# Patient Record
Sex: Male | Born: 1975 | Race: White | Hispanic: Yes | Marital: Married | Smoking: Current every day smoker
Health system: Southern US, Community
[De-identification: ages and names within clinical notes are randomized; demographics above are authoritative.]

## PROBLEM LIST (undated history)

## (undated) DIAGNOSIS — E119 Type 2 diabetes mellitus without complications: Secondary | ICD-10-CM

---

## 2017-06-28 ENCOUNTER — Encounter: Payer: Self-pay | Admitting: Gynecology

## 2017-06-28 ENCOUNTER — Ambulatory Visit
Admission: EM | Admit: 2017-06-28 | Discharge: 2017-06-28 | Disposition: A | Discharge status: Home / Self Care | Payer: Self-pay | Attending: Family Medicine | Admitting: Family Medicine

## 2017-06-28 DIAGNOSIS — L255 Unspecified contact dermatitis due to plants, except food: Secondary | ICD-10-CM

## 2017-06-28 DIAGNOSIS — Z23 Encounter for immunization: Secondary | ICD-10-CM

## 2017-06-28 DIAGNOSIS — R21 Rash and other nonspecific skin eruption: Secondary | ICD-10-CM

## 2017-06-28 MED ORDER — TETANUS-DIPHTH-ACELL PERTUSSIS 5-2.5-18.5 LF-MCG/0.5 IM SUSP
0.5000 mL | Freq: Once | INTRAMUSCULAR | Status: AC
  Administered 2017-06-28: 0.5 mL via INTRAMUSCULAR

## 2017-06-28 MED ORDER — PREDNISONE 10 MG PO TABS: ORAL_TABLET | ORAL | 0 refills | Status: DC

## 2017-06-28 NOTE — ED Provider Notes (Signed)
[facility] ____________________________________________  Time seen: Approximately 9:34 AM  I have reviewed the triage vital signs and the nursing notes.   HISTORY  Chief Complaint Rash  HPI Bill Harrison is a 41 y.o. male presenting with spouse at bedside for evaluation of rash to bilateral arms that is itchy and present for the last week. Patient reports rash onset day after he was working outside in clearing brush along a fence line. States that he believes there is poison oak and poison ivy in the brush and states some of it scratched his right forearm. States rash initially began to right forearm and then quickly spread. States rash is very itchy. Denies any swelling, pain, purulent drainage, fevers or other rash. Denies any shortness of breath, wheezing, chest pain, facial or oral rash, difficulty swallowing or throat discomfort. Has been trying over-the-counter Benadryl cream and calamine lotion without much change. Denies other complaints. Reports otherwise feels well. Denies other aggravating or alleviating factors. Unsure of last tetanus immunization, but states greater than 10 years ago.   Denies chest pain, shortness of breath, abdominal pain, dysuria, extremity pain, extremity swelling. Denies recent sickness. Denies recent antibiotic use. Denies cardiac history. Denies renal insufficiency.   History reviewed. No pertinent past medical history. Denies   There are no active problems to display for this patient.   History reviewed. No pertinent surgical history.   No current facility-administered medications for this encounter.   Current Outpatient Prescriptions:  .  predniSONE (DELTASONE) 10 MG tablet, Take 60mg  orally day one, then 55 mg orally day two, then 50 mg orally day three, then taper by 5 mg daily over 12 days until complete., Disp: 35 tablet, Rfl: 0  Allergies Patient has no known allergies.  No family history on file.  Social  History Social History  Substance Use Topics  . Smoking status: Current Every Day Smoker    Packs/day: 0.50  . Smokeless tobacco: Never Used  . Alcohol use Yes    Review of Systems Constitutional: No fever/chills. ENT: No sore throat. Cardiovascular: Denies chest pain. Respiratory: Denies shortness of breath. Gastrointestinal: No abdominal pain.  Musculoskeletal: Negative for back pain. Skin: As above.   ____________________________________________   PHYSICAL EXAM:  VITAL SIGNS: ED Triage Vitals  Enc Vitals Group     BP 06/28/17 0922 138/77     Pulse Rate 06/28/17 0922 60     Resp 06/28/17 0922 16     Temp 06/28/17 0922 98.1 F (36.7 C)     Temp Source 06/28/17 0922 Oral     SpO2 06/28/17 0922 100 %     Weight 06/28/17 0920 230 lb (104.3 kg)     Height 06/28/17 0920 6\' 1"  (1.854 m)     Head Circumference --      Peak Flow --      Pain Score 06/28/17 0920 2     Pain Loc --      Pain Edu? --      Excl. in GC? --     Constitutional: Alert and oriented. Well appearing and in no acute distress. ENT      Head: Normocephalic and atraumatic.      Mouth/Throat: Mucous membranes are moist. Cardiovascular: Normal rate, regular rhythm. Grossly normal heart sounds.  Good peripheral circulation.  Respiratory: Normal respiratory effort without tachypnea nor retractions. Breath sounds are clear and equal bilaterally. No wheezes, rales, rhonchi. Musculoskeletal: Steady gait. Neurologic:  Normal speech and language.Speech is normal. No gait instability.  Skin:  Skin is warm, dry. Except: bilateral forearms and upper arms with vesicular pruritic rash in clusters and linear groups, healing abrasion to right forearm, some with dried crusts, some vesicular filled, no drainage, no surrounding erythema, nontender, no honey colored crusting, no edema, full range of motion present.  Psychiatric: Mood and affect are normal. Speech and behavior are normal. Patient exhibits appropriate  insight and judgment   ___________________________________________   LABS (all labs ordered are listed, but only abnormal results are displayed)  Labs Reviewed - No data to display ____________________________________________  RADIOLOGY  No results found. ____________________________________________   PROCEDURES Procedures    INITIAL IMPRESSION / ASSESSMENT AND PLAN / ED COURSE  Pertinent labs & imaging results that were available during my care of the patient were reviewed by me and considered in my medical decision making (see chart for details).  Well-appearing patient. No acute distress. Appearance of the plant contact dermatitis to bilateral arms. Tetanus immunization updated. No appearance of secondary infection, but discussed use of Bactroban ointment, which they state they have at home, for any redness appearance use. Avoid trigger. Avoid scratching. Prednisone taper. Encourage fluids, and supportive care. Discussed indication, risks and benefits of medications with patient.  Discussed follow up with Primary care physician this week. Discussed follow up and return parameters including no resolution or any worsening concerns. Patient verbalized understanding and agreed to plan.   ____________________________________________   FINAL CLINICAL IMPRESSION(S) / ED DIAGNOSES  Final diagnoses:  Contact dermatitis due to plant     Discharge Medication List as of 06/28/2017  9:48 AM    START taking these medications   Details  predniSONE (DELTASONE) 10 MG tablet Take 60mg  orally day one, then 55 mg orally day two, then 50 mg orally day three, then taper by 5 mg daily over 12 days until complete., Normal        Note: This dictation was prepared with Dragon dictation along with smaller phrase technology. Any transcriptional errors that result from this process are unintentional.         Renford Dills, NP 06/28/17 1050

## 2017-06-28 NOTE — Discharge Instructions (Signed)
Take medication as prescribed. Avoid trigger. Avoid scratching. Drink plenty of fluids.   Follow up with your primary care physician this week as needed. Return to Urgent care for new or worsening concerns.

## 2017-06-28 NOTE — ED Triage Notes (Signed)
Patient with rash on bilateral arms.

## 2018-02-11 ENCOUNTER — Other Ambulatory Visit: Payer: Self-pay

## 2018-02-11 ENCOUNTER — Encounter: Payer: Self-pay | Admitting: Emergency Medicine

## 2018-02-11 ENCOUNTER — Ambulatory Visit
Admission: EM | Admit: 2018-02-11 | Discharge: 2018-02-11 | Disposition: A | Discharge status: Home / Self Care | Payer: Self-pay | Attending: Family Medicine | Admitting: Family Medicine

## 2018-02-11 DIAGNOSIS — L0231 Cutaneous abscess of buttock: Secondary | ICD-10-CM

## 2018-02-11 MED ORDER — MUPIROCIN 2 % EX OINT: 1.0000 "application " | TOPICAL_OINTMENT | Freq: Two times a day (BID) | CUTANEOUS | 0 refills | Status: DC

## 2018-02-11 MED ORDER — DOXYCYCLINE HYCLATE 100 MG PO CAPS: 100.0000 mg | ORAL_CAPSULE | Freq: Two times a day (BID) | ORAL | 0 refills | Status: DC

## 2018-02-11 NOTE — Discharge Instructions (Signed)
Warm compresses several times daily.  Medications as prescribed.  Take care  Dr. Adriana Simasook

## 2018-02-11 NOTE — ED Provider Notes (Signed)
[facility]    CSN: [id number] Arrival date & time: 02/11/18  1018   History   Chief Complaint Chief Complaint  Patient presents with  . Abscess   HPI   42 year old male presents with the above complaint.  Patient states that he has had an abscess on his right buttock for the past 3 days.  He states that yesterday he squeezed it and it drained.  States that since that time, he is felt improved.  It is tender, moderate in severity.  No reported fever or chills.  He does report erythema at the site.  No known inciting factor.  No known exacerbating factors.  No other associated symptoms.  No other complaints.  Social History Social History   Tobacco Use  . Smoking status: Current Every Day Smoker    Packs/day: 0.50  . Smokeless tobacco: Never Used  Substance Use Topics  . Alcohol use: Yes  . Drug use: No   Allergies   Patient has no known allergies.  Review of Systems Review of Systems  Constitutional: Negative for fever.  Musculoskeletal:       Right buttock abscess.   Physical Exam Triage Vital Signs ED Triage Vitals  Enc Vitals Group     BP 02/11/18 1032 (!) 147/100     Pulse Rate 02/11/18 1032 81     Resp 02/11/18 1032 16     Temp 02/11/18 1032 98.9 F (37.2 C)     Temp Source 02/11/18 1032 Oral     SpO2 02/11/18 1032 100 %     Weight 02/11/18 1030 240 lb (108.9 kg)     Height 02/11/18 1030 6\' 2"  (1.88 m)     Head Circumference --      Peak Flow --      Pain Score 02/11/18 1030 4     Pain Loc --      Pain Edu? --      Excl. in GC? --    Updated Vital Signs BP (!) 147/100 (BP Location: Left Arm)   Pulse 81   Temp 98.9 F (37.2 C) (Oral)   Resp 16   Ht 6\' 2"  (1.88 m)   Wt 240 lb (108.9 kg)   SpO2 100%   BMI 30.81 kg/m   Physical Exam  Constitutional: He is oriented to person, place, and time. He appears well-developed. No distress.  HENT:  Head: Normocephalic and atraumatic.  Pulmonary/Chest: Effort normal. No respiratory  distress.  Neurological: He is alert and oriented to person, place, and time.  Skin:  Right buttock - 5 cm x 4 cm area of erythema and induration. Draining currently. Tender to palpation.  Psychiatric: He has a normal mood and affect. His behavior is normal.  Nursing note and vitals reviewed.   UC Treatments / Results  Labs (all labs ordered are listed, but only abnormal results are displayed) Labs Reviewed - No data to display  EKG None Radiology No results found.  Procedures Procedures (including critical care time)  Medications Ordered in UC Medications - No data to display   Initial Impression / Assessment and Plan / UC Course  I have reviewed the triage vital signs and the nursing notes.  Pertinent labs & imaging results that were available during my care of the patient were reviewed by me and considered in my medical decision making (see chart for details).    42 year old male presents with an abscess.  Treating with doxycycline and mupirocin.  No I&D today as it  is already draining.  He is already improving.  Final Clinical Impressions(s) / UC Diagnoses   Final diagnoses:  Abscess of buttock, right    ED Discharge Orders        Ordered    doxycycline (VIBRAMYCIN) 100 MG capsule  2 times daily     02/11/18 1050    mupirocin ointment (BACTROBAN) 2 %  2 times daily     02/11/18 1050     Controlled Substance Prescriptions [place] Controlled Substance Registry consulted? Not Applicable   Tommie SamsCook, Bethel Gaglio G, DO 02/11/18 1128

## 2018-02-11 NOTE — ED Triage Notes (Signed)
Patient c/o abscess on his right buttock for the past 3 days.

## 2021-02-04 ENCOUNTER — Encounter: Payer: Self-pay | Admitting: Emergency Medicine

## 2021-02-04 ENCOUNTER — Ambulatory Visit (INDEPENDENT_AMBULATORY_CARE_PROVIDER_SITE_OTHER): Payer: 59

## 2021-02-04 ENCOUNTER — Ambulatory Visit
Admission: EM | Admit: 2021-02-04 | Discharge: 2021-02-04 | Disposition: A | Discharge status: Home / Self Care | Payer: 59 | Attending: Family Medicine | Admitting: Family Medicine

## 2021-02-04 ENCOUNTER — Other Ambulatory Visit: Payer: Self-pay

## 2021-02-04 DIAGNOSIS — M79645 Pain in left finger(s): Secondary | ICD-10-CM | POA: Diagnosis not present

## 2021-02-04 DIAGNOSIS — R2232 Localized swelling, mass and lump, left upper limb: Secondary | ICD-10-CM

## 2021-02-04 DIAGNOSIS — L089 Local infection of the skin and subcutaneous tissue, unspecified: Secondary | ICD-10-CM

## 2021-02-04 MED ORDER — DOXYCYCLINE HYCLATE 100 MG PO CAPS: 100.0000 mg | ORAL_CAPSULE | Freq: Two times a day (BID) | ORAL | 0 refills | Status: DC

## 2021-02-04 MED ORDER — MUPIROCIN 2 % EX OINT: 1.0000 "application " | TOPICAL_OINTMENT | Freq: Three times a day (TID) | CUTANEOUS | 0 refills | Status: AC

## 2021-02-04 NOTE — ED Provider Notes (Signed)
[facility]    CSN: [id number] Arrival date & time: 02/04/21  0919      History   Chief Complaint Chief Complaint  Patient presents with  . Foreign Body in Skin   HPI  45 year old male presents with the above complaint.  Patient works [employer] representative.  He states that he is concerned that he has something in his finger.  He does not recall a direct injury or direct trauma with a foreign body.  The area is very swollen and erythematous.  He has now drained the area and has been draining pus.  He denies any significant pain currently.  No relieving factors.  No fever.  No other complaints.   Home Medications    Prior to Admission medications   Medication Sig Start Date End Date Taking? Authorizing Provider  doxycycline (VIBRAMYCIN) 100 MG capsule Take 1 capsule (100 mg total) by mouth 2 (two) times daily. 02/04/21  Yes Marnae Madani G, DO  mupirocin ointment (BACTROBAN) 2 % Apply 1 application topically 3 (three) times daily for 7 days. 02/04/21 02/11/21 Yes Tommie Sams, DO    Family History Family History  Problem Relation Age of Onset  . Diabetes Mother   . Diabetes Father     Social History Social History   Tobacco Use  . Smoking status: Current Every Day Smoker    Packs/day: 0.50  . Smokeless tobacco: Never Used  Substance Use Topics  . Alcohol use: Yes  . Drug use: No     Allergies   Patient has no known allergies.   Review of Systems Review of Systems  Constitutional: Negative.   Skin: Positive for wound.   Physical Exam Triage Vital Signs ED Triage Vitals  Enc Vitals Group     BP 02/04/21 1040 134/81     Pulse Rate 02/04/21 1040 (!) 56     Resp 02/04/21 1040 18     Temp 02/04/21 1040 98 F (36.7 C)     Temp Source 02/04/21 1040 Oral     SpO2 02/04/21 1040 100 %     Weight 02/04/21 1038 240 lb (108.9 kg)     Height 02/04/21 1038 6\' 2"  (1.88 m)     Head Circumference --      Peak Flow --      Pain Score 02/04/21 1038 0     Pain Loc --       Pain Edu? --      Excl. in GC? --    Updated Vital Signs BP 134/81 (BP Location: Left Arm)   Pulse (!) 56   Temp 98 F (36.7 C) (Oral)   Resp 18   Ht 6\' 2"  (1.88 m)   Wt 108.9 kg   SpO2 100%   BMI 30.81 kg/m   Visual Acuity Right Eye Distance:   Left Eye Distance:   Bilateral Distance:    Right Eye Near:   Left Eye Near:    Bilateral Near:     Physical Exam Constitutional:      General: He is not in acute distress.    Appearance: He is not ill-appearing.  HENT:     Head: Normocephalic and atraumatic.  Eyes:     General:        Right eye: No discharge.        Left eye: No discharge.     Conjunctiva/sclera: Conjunctivae normal.  Pulmonary:     Effort: Pulmonary effort is normal. No respiratory distress.  Musculoskeletal:  Comments: Left index finger -swelling and erythema noted to the proximal finger above the MCP joint.  There is purulent discharge/drainage noted.  Tender to palpation.  Neurological:     Mental Status: He is alert.  Psychiatric:        Mood and Affect: Mood normal.        Behavior: Behavior normal.    UC Treatments / Results  Labs (all labs ordered are listed, but only abnormal results are displayed) Labs Reviewed - No data to display  EKG   Radiology DG Finger Index Left  Result Date: 02/04/2021 CLINICAL DATA:  Possible metal or other foreign body in finger for 1 week EXAM: LEFT INDEX FINGER 2+V COMPARISON:  None. FINDINGS: There is no evidence of fracture or dislocation. There is no evidence of arthropathy or other focal bone abnormality. Soft tissue edema about the digit. No radiopaque foreign body. IMPRESSION: 1. No fracture or dislocation of the left index finger. No radiopaque foreign body. 2.  Soft tissue edema about the digit. Electronically Signed   By: Lauralyn Primes M.D.   On: 02/04/2021 11:09    Procedures Procedures (including critical care time)  Medications Ordered in UC Medications - No data to display  Initial  Impression / Assessment and Plan / UC Course  I have reviewed the triage vital signs and the nursing notes.  Pertinent labs & imaging results that were available during my care of the patient were reviewed by me and considered in my medical decision making (see chart for details).    45 year old male presents with concern for a foreign body in his left index finger.  Swelling, erythema, and purulent drainage noted on exam.  Area is clearly infected.  X-ray was obtained and was independent reviewed by me.  Interpretation: No appreciable foreign body.  Swelling noted.  Placing on antibiotic therapy with doxycycline and Bactroban ointment.  Advised to go to the hospital if he worsens or fails to improve.  Final Clinical Impressions(s) / UC Diagnoses   Final diagnoses:  Finger infection     Discharge Instructions     Xray negative.  Medication as prescribed.  If this worsens or does not improve, go to the hospital.  Dr. Adriana Simas    ED Prescriptions    Medication Sig Dispense Auth. Provider   doxycycline (VIBRAMYCIN) 100 MG capsule Take 1 capsule (100 mg total) by mouth 2 (two) times daily. 20 capsule [place], Kinta Martis G, DO   mupirocin ointment (BACTROBAN) 2 % Apply 1 application topically 3 (three) times daily for 7 days. 30 g Tommie Sams, DO     PDMP not reviewed this encounter.   Tommie Sams, [place] 02/04/21 1132

## 2021-02-04 NOTE — ED Triage Notes (Signed)
Patient states he thinks he has some metal or wood in his left pointer finger x 1 week.

## 2021-02-04 NOTE — Discharge Instructions (Signed)
Xray negative.  Medication as prescribed.  If this worsens or does not improve, go to the hospital.  Dr. Adriana Simas

## 2021-10-24 ENCOUNTER — Ambulatory Visit
Admission: EM | Admit: 2021-10-24 | Discharge: 2021-10-24 | Disposition: A | Discharge status: Home / Self Care | Payer: BC Managed Care – PPO | Attending: Emergency Medicine | Admitting: Emergency Medicine

## 2021-10-24 ENCOUNTER — Other Ambulatory Visit: Payer: Self-pay

## 2021-10-24 DIAGNOSIS — E871 Hypo-osmolality and hyponatremia: Secondary | ICD-10-CM | POA: Diagnosis present

## 2021-10-24 DIAGNOSIS — M791 Myalgia, unspecified site: Secondary | ICD-10-CM | POA: Diagnosis not present

## 2021-10-24 HISTORY — DX: Type 2 diabetes mellitus without complications: E11.9

## 2021-10-24 LAB — CK: Total CK: 110 U/L (ref 49–397)

## 2021-10-24 LAB — COMPREHENSIVE METABOLIC PANEL
ALT: 30 U/L (ref 0–44)
AST: 24 U/L (ref 15–41)
Albumin: 4.4 g/dL (ref 3.5–5.0)
Alkaline Phosphatase: 87 U/L (ref 38–126)
Anion gap: 8 (ref 5–15)
BUN: 12 mg/dL (ref 6–20)
CO2: 24 mmol/L (ref 22–32)
Calcium: 9.2 mg/dL (ref 8.9–10.3)
Chloride: 99 mmol/L (ref 98–111)
Creatinine, Ser: 0.75 mg/dL (ref 0.61–1.24)
GFR, Estimated: >60 mL/min (ref >60)
Glucose, Bld: 211 mg/dL — ABNORMAL HIGH (ref 70–99)
Potassium: 4 mmol/L (ref 3.5–5.1)
Sodium: 131 mmol/L — ABNORMAL LOW (ref 135–145)
Total Bilirubin: 1.4 mg/dL — ABNORMAL HIGH (ref 0.3–1.2)
Total Protein: 7.4 g/dL (ref 6.5–8.1)

## 2021-10-24 NOTE — Discharge Instructions (Addendum)
Your blood work today was all reassuring though your sodium was mildly low.  Continue your metformin as directed at your time of discharge from the emergency department.  When should increase your oral fluid intake to a goal of 64 ounces a day.  You need to drink water and diet Gatorade or Pedialyte as your sodium is a little low and you need to make sure that you are replacing that through your diet.  Follow the diabetic diet plan provided at discharge.  At your time of discharge from the emergency department you were referred to family medicine to establish care for your diabetes.  Please make and keep that follow-up appointment.

## 2021-10-24 NOTE — ED Provider Notes (Signed)
[facility]    CSN: [id number] Arrival date & time: 10/24/21  1019      History   Chief Complaint Chief Complaint  Patient presents with   Fatigue    HPI Bill Harrison is a 45 y.o. male.   HPI  45 year old male here for evaluation of body aches and fatigue.  Patient was evaluated and [facility] emergency department 4 days ago and started on metformin for hyperglycemia.  He started taking his medication 3 days ago.  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  He is also had some mild nausea.  Patient's initial blood sugar in the emergency department was 500 and he has been checking it every morning.  This morning his blood sugar was 139 fasting.  Patient reports that he is consuming fruit and beans but he is avoiding rice and potatoes present.  He also reports that he only drinks 3-4 bottles of water during the day.  Past Medical History:  Diagnosis Date   Diabetes mellitus without complication ([place])     There are no problems to display for this patient.   History reviewed. No pertinent surgical history.     Home Medications    Prior to Admission medications   Medication Sig Start Date End Date Taking? Authorizing Provider  Blood Glucose Monitoring Suppl (FIFTY50 GLUCOSE METER 2.0) w/Device KIT See admin instructions. 10/19/21 10/19/22 Yes [provider]  CONTOUR NEXT TEST test strip  10/20/21   [provider]  metFORMIN (GLUCOPHAGE) 500 MG tablet Take 500 mg by mouth 2 (two) times daily. 10/20/21   [provider]  Microlet Lancets MISC  10/20/21   [provider]    Family History Family History  Problem Relation Age of Onset   Diabetes Mother    Diabetes Father     Social History Social History   Tobacco Use   Smoking status: Every Day    Packs/day: 0.50     Types: Cigarettes   Smokeless tobacco: Never  Substance Use Topics   Alcohol use: Yes   Drug use: No     Allergies   Patient has no known allergies.   Review of Systems Review of Systems  Constitutional:  Positive for fatigue. Negative for activity change, appetite change and fever.  Gastrointestinal:  Positive for nausea. Negative for abdominal pain, diarrhea and vomiting.  Musculoskeletal:  Positive for myalgias.  Hematological: Negative.   Psychiatric/Behavioral: Negative.      Physical Exam Triage Vital Signs ED Triage Vitals  Enc Vitals Group     BP 10/24/21 1132 138/86     Pulse Rate 10/24/21 1132 66     Resp 10/24/21 1132 18     Temp 10/24/21 1132 98.7 F (37.1 C)     Temp Source 10/24/21 1132 Oral     SpO2 10/24/21 1132 100 %     Weight 10/24/21 1130 210 lb (95.3 kg)     Height 10/24/21 1130 6' (1.829 m)     Head Circumference --      Peak Flow --      Pain Score 10/24/21 1129 0     Pain Loc --      Pain Edu? --      Excl. in [place]? --    No data found.  Updated Vital Signs BP 138/86 (BP Location: Left  Arm)    Pulse 66    Temp 98.7 F (37.1 C) (Oral)    Resp 18    Ht 6' (1.829 m)    Wt 210 lb (95.3 kg)    SpO2 100%    BMI 28.48 kg/m   Visual Acuity Right Eye Distance:   Left Eye Distance:   Bilateral Distance:    Right Eye Near:   Left Eye Near:    Bilateral Near:     Physical Exam Vitals and nursing note reviewed.  Constitutional:      General: He is not in acute distress.    Appearance: Normal appearance. He is not ill-appearing.  HENT:     Head: Normocephalic and atraumatic.     Nose: Nose normal.     Mouth/Throat:     Mouth: Mucous membranes are moist.     Pharynx: Oropharynx is clear. No oropharyngeal exudate or posterior oropharyngeal erythema.  Eyes:     General: No scleral icterus.    Extraocular Movements: Extraocular movements intact.     Pupils: Pupils are equal, round, and reactive to light.  Cardiovascular:     Rate and  Rhythm: Normal rate and regular rhythm.     Pulses: Normal pulses.     Heart sounds: Normal heart sounds. No murmur heard.   No gallop.  Pulmonary:     Effort: Pulmonary effort is normal.     Breath sounds: Normal breath sounds. No wheezing, rhonchi or rales.  Abdominal:     General: Abdomen is flat.     Palpations: Abdomen is soft.     Tenderness: There is no abdominal tenderness.  Musculoskeletal:        General: No swelling, tenderness, deformity or signs of injury. Normal range of motion.     Cervical back: Normal range of motion and neck supple.  Lymphadenopathy:     Cervical: No cervical adenopathy.  Skin:    General: Skin is warm and dry.     Capillary Refill: Capillary refill takes less than 2 seconds.     Findings: No erythema or rash.  Neurological:     General: No focal deficit present.     Mental Status: He is alert and oriented to person, place, and time.  Psychiatric:        Mood and Affect: Mood normal.        Behavior: Behavior normal.        Thought Content: Thought content normal.        Judgment: Judgment normal.     UC Treatments / Results  Labs (all labs ordered are listed, but only abnormal results are displayed) Labs Reviewed  COMPREHENSIVE METABOLIC PANEL - Abnormal; Notable for the following components:      Result Value   Sodium 131 (*)    Glucose, Bld 211 (*)    Total Bilirubin 1.4 (*)    All other components within normal limits  CK    EKG   Radiology No results found.  Procedures Procedures (including critical care time)  Medications Ordered in UC Medications - No data to display  Initial Impression / Assessment and Plan / UC Course  I have reviewed the triage vital signs and the nursing notes.  Pertinent labs & imaging results that were available during my care of the patient were reviewed by me and considered in my medical decision making (see chart for details).  Patient is a nontoxic-appearing 45 year old male here for  evaluation of body aches and fatigue with  associated nausea that started yesterday afternoon.  He was recently started on metformin for hyperglycemia and he has been trying to figure out his diet but he is still consuming fruit and beans.  He denies any rice or potatoes.  But he still consuming a carbohydrate rich diet.  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  At the end of the day he had pain in both of his forearms but nowhere else on his body.  He has had some churning in his abdomen when he eats.  When asked to describe his diet he has had excessive beans, fruit, and chicken.  He also states that he only drinks about 3-4 bottles of water during the course of the day.  On physical exam patient is alert and oriented x4.  Pupils are equal round reactive and EOMs intact.  No scleral icterus noted.  Cardiopulmonary exam reveals clear lung sounds in all fields and heart sounds are S1-S2 without murmur, rub, or gallop.  Abdomen is soft and nontender.  Patient has no tenderness with palpation of the muscle groups of either arm, or his back.  He states that he still feels like it sore and he also had some mild tremors in his fingers when he got home yesterday and some tremors when he tried to sleep last night.  Those have resolved.  Given patient's recent metformin prescription I suspect that patient's symptoms are partially a result of normal effects of the medication, especially coupled with his carbohydrate rich diet, as far as GI symptoms ago.  Without any tenderness to palpation of the muscle groups I do not feel that he is experiencing lactic acidosis, which can happen as result of metformin, but I will check a CMP and CK.  I am unable to check lactic acid level at the urgent care due to processing issues.  I suspect that his muscle pain and fatigue is more result of his physical labor versus adverse effects from the metformin.  If patient's blood work  is normal I will discharge the patient home with an ADA diet plan.  I have also encouraged him to increase his water intake to at least 64 ounces a day, and closer to his body weight in ounces if he is working in the heat.  CMP shows mild hyponatremia with a sodium of 131.  Potassium, BUN creatinine, and calcium are all normal.  Transaminases are unremarkable.  T bili is mildly elevated 1.4.  Glucose is 211 the patient is postprandial.  Total CK is 110.  I suspect the patient's muscle soreness in his forearms is secondary to his manual labor and his GI symptoms are secondary to the metformin.  I will have the patient improve his oral intake of water to improve hydration and we will also discharge patient home with an ADA diet plan.  Patient to follow-up with PCP.   Final Clinical Impressions(s) / UC Diagnoses   Final diagnoses:  Myalgia  Hyponatremia     Discharge Instructions      Your blood work today was all reassuring though your sodium was mildly low.  Continue your metformin as directed at your time of discharge from the emergency department.  When should increase your oral fluid intake to a goal of 64 ounces a day.  You need to drink water and diet Gatorade or Pedialyte as your sodium is a little low and you need to make sure that you are  replacing that through your diet.  Follow the diabetic diet plan provided at discharge.  At your time of discharge from the emergency department you were referred to family medicine to establish care for your diabetes.  Please make and keep that follow-up appointment.     ED Prescriptions   None    PDMP not reviewed this encounter.   Margarette Canada, NP 10/24/21 [phone]

## 2021-10-24 NOTE — ED Triage Notes (Signed)
Pt here with C/O body aches and fatigue. Was in ER last weekend for hyperglycemia and started taken medication on Monday. States he started feeling that yesterday evening. [id number] this morning.

## 2022-11-27 IMAGING — CR DG FINGER INDEX 2+V*L*
3 series · 3 of 3 positions shown · non-contrast
Comparison: None.

CLINICAL DATA: Possible metal or other foreign body in finger for 1
week

EXAM:
LEFT INDEX FINGER 2+V

[finger ap]
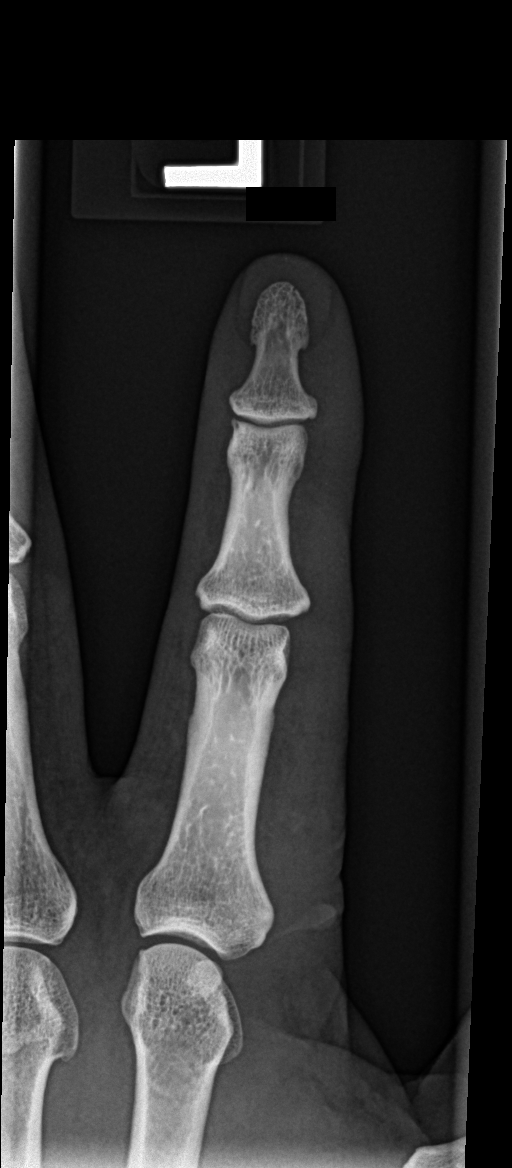

[finger obl]
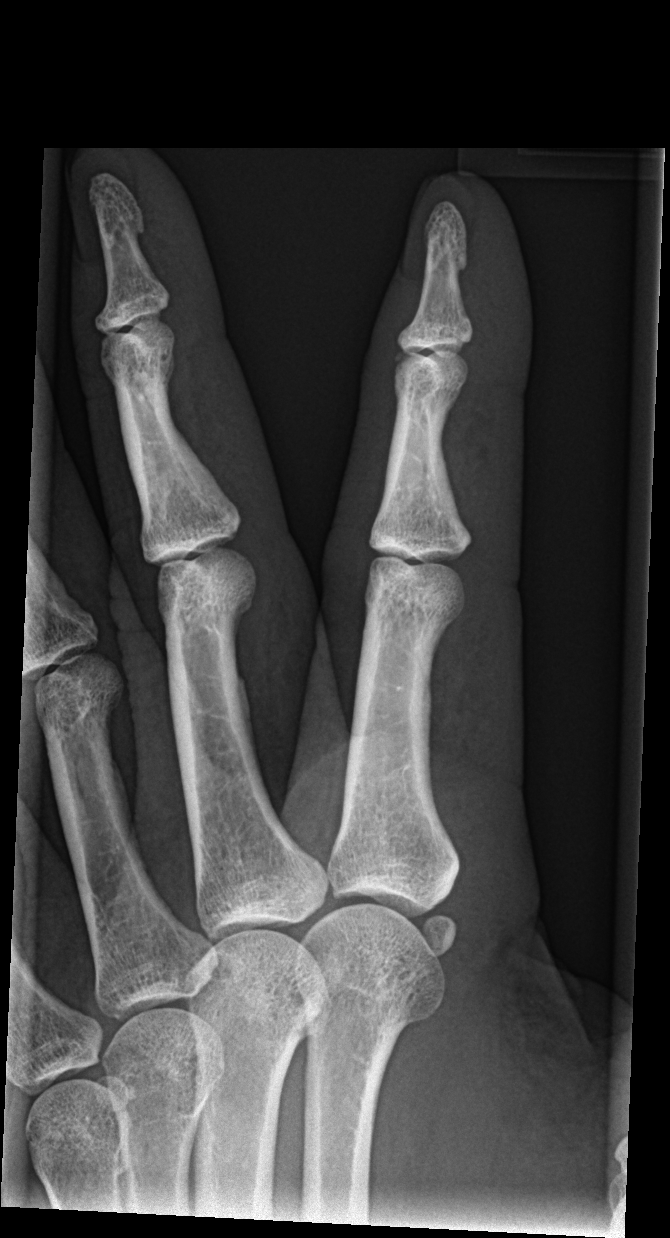

[finger lat]
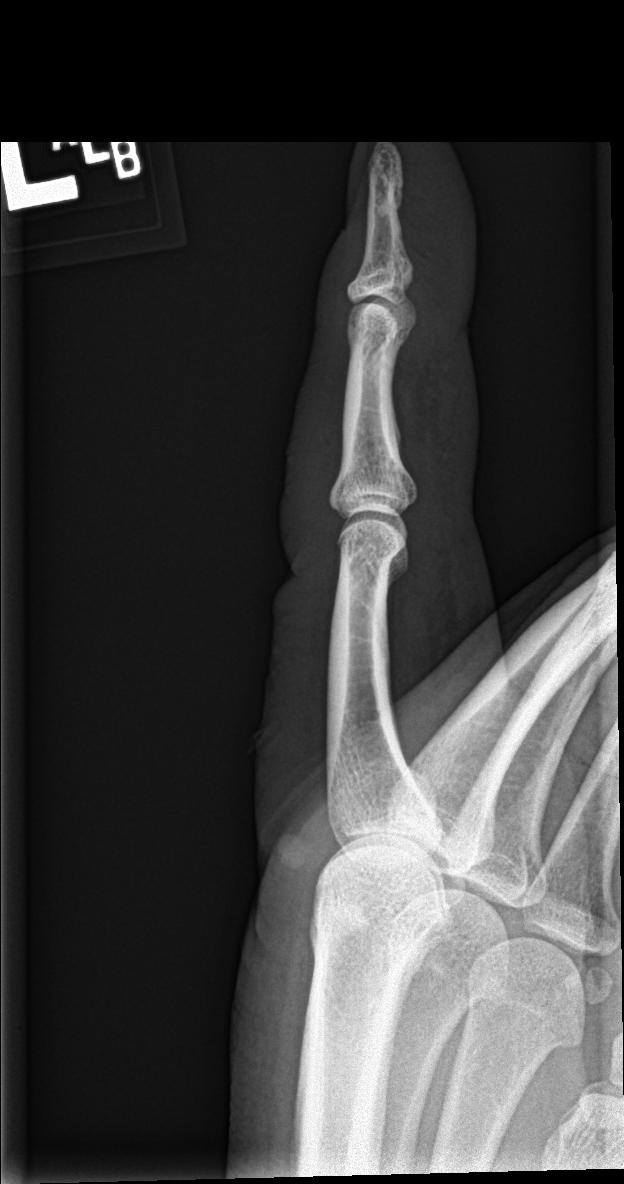

[3 of 3 positions shown; findings below may reference images not displayed]

FINDINGS: There is no evidence of fracture or dislocation. There is no
evidence of arthropathy or other focal bone abnormality. Soft tissue
edema about the digit. No radiopaque foreign body.
IMPRESSION: 1. No fracture or dislocation of the left index finger. No
radiopaque foreign body.

2.  Soft tissue edema about the digit.
